# Patient Record
Sex: Female | Born: 1955 | Race: White | Hispanic: No | Marital: Married | Smoking: Never smoker
Health system: Southern US, Community
[De-identification: ages and names within clinical notes are randomized; demographics above are authoritative.]

## PROBLEM LIST (undated history)

## (undated) DIAGNOSIS — M797 Fibromyalgia: Secondary | ICD-10-CM

## (undated) DIAGNOSIS — F419 Anxiety disorder, unspecified: Secondary | ICD-10-CM

## (undated) HISTORY — PX: EYE SURGERY: SHX253

## (undated) HISTORY — PX: TUBAL LIGATION: SHX77

---

## 2017-11-25 ENCOUNTER — Other Ambulatory Visit: Payer: Self-pay | Admitting: Internal Medicine

## 2017-11-25 DIAGNOSIS — Z1231 Encounter for screening mammogram for malignant neoplasm of breast: Secondary | ICD-10-CM

## 2018-01-21 ENCOUNTER — Encounter: Payer: Self-pay | Admitting: Radiology

## 2018-01-21 ENCOUNTER — Ambulatory Visit
Admission: RE | Admit: 2018-01-21 | Discharge: 2018-01-21 | Disposition: A | Discharge status: Home / Self Care | Payer: PRIVATE HEALTH INSURANCE | Source: Ambulatory Visit | Attending: Internal Medicine | Admitting: Internal Medicine

## 2018-01-21 DIAGNOSIS — Z1231 Encounter for screening mammogram for malignant neoplasm of breast: Secondary | ICD-10-CM

## 2018-12-19 ENCOUNTER — Other Ambulatory Visit: Payer: Self-pay | Admitting: Family Medicine

## 2018-12-19 DIAGNOSIS — Z1231 Encounter for screening mammogram for malignant neoplasm of breast: Secondary | ICD-10-CM

## 2019-02-16 ENCOUNTER — Ambulatory Visit: Payer: PRIVATE HEALTH INSURANCE

## 2019-02-25 ENCOUNTER — Other Ambulatory Visit: Payer: Self-pay

## 2019-02-25 ENCOUNTER — Ambulatory Visit
Admission: RE | Admit: 2019-02-25 | Discharge: 2019-02-25 | Disposition: A | Discharge status: Home / Self Care | Payer: PRIVATE HEALTH INSURANCE | Source: Ambulatory Visit | Attending: Family Medicine | Admitting: Family Medicine

## 2019-02-25 DIAGNOSIS — Z1231 Encounter for screening mammogram for malignant neoplasm of breast: Secondary | ICD-10-CM

## 2019-07-10 ENCOUNTER — Other Ambulatory Visit: Payer: Self-pay

## 2019-07-10 DIAGNOSIS — Z20822 Contact with and (suspected) exposure to covid-19: Secondary | ICD-10-CM

## 2019-07-12 LAB — NOVEL CORONAVIRUS, NAA: SARS-CoV-2, NAA: NOT DETECTED

## 2019-10-04 ENCOUNTER — Other Ambulatory Visit: Payer: Self-pay

## 2019-10-04 ENCOUNTER — Encounter (HOSPITAL_BASED_OUTPATIENT_CLINIC_OR_DEPARTMENT_OTHER): Payer: Self-pay | Admitting: Obstetrics & Gynecology

## 2019-10-06 ENCOUNTER — Other Ambulatory Visit: Payer: Self-pay

## 2019-10-06 ENCOUNTER — Encounter (HOSPITAL_BASED_OUTPATIENT_CLINIC_OR_DEPARTMENT_OTHER)
Admission: RE | Admit: 2019-10-06 | Discharge: 2019-10-06 | Disposition: A | Discharge status: Home / Self Care | Payer: PRIVATE HEALTH INSURANCE | Source: Ambulatory Visit | Attending: Obstetrics & Gynecology | Admitting: Obstetrics & Gynecology

## 2019-10-06 DIAGNOSIS — Z01812 Encounter for preprocedural laboratory examination: Secondary | ICD-10-CM | POA: Insufficient documentation

## 2019-10-06 LAB — COMPREHENSIVE METABOLIC PANEL
ALT: 16 U/L (ref 0–44)
AST: 21 U/L (ref 15–41)
Albumin: 3.8 g/dL (ref 3.5–5.0)
Alkaline Phosphatase: 61 U/L (ref 38–126)
Anion gap: 8 (ref 5–15)
BUN: 21 mg/dL (ref 8–23)
CO2: 31 mmol/L (ref 22–32)
Calcium: 9.4 mg/dL (ref 8.9–10.3)
Chloride: 106 mmol/L (ref 98–111)
Creatinine, Ser: 0.75 mg/dL (ref 0.44–1.00)
GFR calc Af Amer: >60 mL/min (ref >60)
GFR calc non Af Amer: >60 mL/min (ref >60)
Glucose, Bld: 86 mg/dL (ref 70–99)
Potassium: 5.3 mmol/L — ABNORMAL HIGH (ref 3.5–5.1)
Sodium: 145 mmol/L (ref 135–145)
Total Bilirubin: 0.3 mg/dL (ref 0.3–1.2)
Total Protein: 6.5 g/dL (ref 6.5–8.1)

## 2019-10-06 LAB — CBC
HCT: 41.9 % (ref 36.0–46.0)
Hemoglobin: 13.8 g/dL (ref 12.0–15.0)
MCH: 31.4 pg (ref 26.0–34.0)
MCHC: 32.9 g/dL (ref 30.0–36.0)
MCV: 95.2 fL (ref 80.0–100.0)
Platelets: 295 10*3/uL (ref 150–400)
RBC: 4.4 MIL/uL (ref 3.87–5.11)
RDW: 11.9 % (ref 11.5–15.5)
WBC: 4.6 10*3/uL (ref 4.0–10.5)
nRBC: 0 % (ref 0.0–0.2)

## 2019-10-06 NOTE — Progress Notes (Signed)

## 2019-10-06 NOTE — Progress Notes (Signed)
Potassium 5.3. Reviewed with Dr. Sampson Goon. Okay to proceed with surgery as scheduled.

## 2019-10-07 ENCOUNTER — Other Ambulatory Visit (HOSPITAL_COMMUNITY)
Admission: RE | Admit: 2019-10-07 | Discharge: 2019-10-07 | Disposition: A | Discharge status: Home / Self Care | Payer: PRIVATE HEALTH INSURANCE | Source: Ambulatory Visit | Attending: Obstetrics & Gynecology | Admitting: Obstetrics & Gynecology

## 2019-10-07 DIAGNOSIS — Z01812 Encounter for preprocedural laboratory examination: Secondary | ICD-10-CM | POA: Insufficient documentation

## 2019-10-07 DIAGNOSIS — Z20822 Contact with and (suspected) exposure to covid-19: Secondary | ICD-10-CM | POA: Diagnosis not present

## 2019-10-07 NOTE — H&P (Signed)
64yo PM female who presents for hysteroscopy, D&C due to postmenopausal bleeding.  Today she notes that initially she didn't have any bleeding, but then out of nowhere starting on 1/6- she noted pink spotting when she wiped. Then the next day in the toilet and on a pad bright red blood that lasted for the day and then maybe a little spotting the next day. Denies bleeding after sex. No further bleeding since that time.  She was last seen for this, 08/09/2018. Previously noted that on Monday 11/16- out of nowhere- she went to the bathroom- and noted bright red blood. Blood was in toilet and on toilet paper. Prior to this episdoe this has never happened. Denies vaginal discharge, itching or irritation. No pelvic or abdominal pain. No other acute complaints   US showed: 7.2cm anteverted uterus with thickened lining with tiny cystic areas within- no blood flow noted. Normal ovaries bilaterally EMB: 11/19- scant fragments of benign endometrium  Last pap/hpv 11/2017 neg Last mam 02/2019 Cat. I    Current Medications  Taking   Acetaminophen-Codeine 300-30 MG Tablet 1 tablet as needed Orally once a day as needed   Calcium 1 capsule Orally Once a day   Effexor XR(Venlafaxine HCl ER) 150 MG Capsule Extended Release 24 Hour take 1 capsule by mouth once every day with food Orally Once a day   FiberCon(Fiber) 625 MG Tablet 2 tablets Orally once a day   Medication List reviewed and reconciled with the patient    Past Medical History  Anxiety.   Endometrial cells on cervical Pap smear inconsistent w/LMP.   Fibromyalgia.   FH: breast cancer paunt and maunt Pt Ashkenazi .   Colon adenoma 2011 repeat hyperplastic cecal polyp 2016.   H/O dysplastic nevus, seen by dermatology Dr. Venancio Poisson.   Last mammogram 12/16/2016 BIRADS 1 ; 02/2019 normal.           Surgical History  Tugal Ligation 1987  Cataracts- bilateral 03/2018  Endometrial biopsy 07/2019   Family History  Father: alive, dementia,  diagnosed with Hypertension  Mother: deceased, high cholesterol, heart failure and kidney failure, Hypertension  Paternal Grand Father: deceased  Paternal Grand Mother: deceased, MI  Maternal Grand Father: deceased, GI cancer  Maternal Grand Mother: deceased  Brother 1: alive  1 brother(s) - healthy.   Breast CA- P aunt and Maunt. No GYN CA.   Social History  General:  Tobacco use  cigarettes: Never smoked Tobacco history last updated 10/02/2019 Additional Findings: Tobacco Non-User Non-smoker for personal reasons Alcohol: yes, beer/wine, 1 drink a day.  no Caffeine.  no Recreational drug use.  Exercise: 5+ times weekly.  Marital Status: married.  Children: 1 son 1 daughter, 3 grandchildren (1 lives in [place]).  OCCUPATION: Charity fundraiser, retired.  Religion: Jewish.  Seat belt use: yes.  Moved to [place] in 2018. Son got a job at [employer]. Husband is [employer]. Has a 38 year old father at [employer]. He has some dementia. Talks to father qid b/c she cannot visit much due to COVID. Takes care of grandchildren, age 86, 4 year and 4 month; daughter lives here with 2 kids; son is in [place], [place].   Gyn History  Sexual activity currently sexually active.  LMP 3 yrs ago.  Birth control tubal ligation .  Last pap smear date 11-2017 neg .  Last mammogram date 02-2019 .  menopause 2 yrs ago.    OB History  Number of pregnancies 2.  Pregnancy # 1 vaginal delivery.  Pregnancy # 2 vaginal delivery.  Full-term delivery 2.    Allergies  N.K.D.A.   Hospitalization/Major Diagnostic Procedure  none in the past year 09-2019   Review of Systems  CONSTITUTIONAL:  no Appetite changes. no Chills. no Fatigue. no Fever.  CARDIOLOGY:  no Chest pain.  RESPIRATORY:  no Shortness of breath. no Cough.  UROLOGY:  no Dysuria. no Urinary frequency. no Urinary incontinence. no Urinary urgency.  GASTROENTEROLOGY:  no Abdominal pain. no Change in bowel habits. no Change in bowel movements. Constipation yes.   FEMALE REPRODUCTIVE:  See HPI for details. no Abnormal vaginal discharge. no Breast lumps or discharge. no Breast pain. no Hot flashes. no Sexual problems. no Vaginal irritation. no Vaginal itching.  NEUROLOGY:  no Dizziness. no Headache.  PSYCHOLOGY:  Anxiety yes. no Depression.  SKIN:  no Rash. no Hives.  HEMATOLOGY/LYMPH:  no Anemia. Using Blood Thinners no.     Vital Signs  Wt 96.0, Wt change -.4 lb, Ht 63.25, BMI 16.87, Temp 96.9, Pulse sitting 76, BP sitting 100/60.   Examination  General Examination: CONSTITUTIONAL: well developed, well nourished.  SKIN: warm and dry, no rashes.  NECK: supple, normal appearance.  LUNGS: clear to auscultation bilaterally, no wheezes, rhonchi, rales.  HEART: no murmurs, regular rate and rhythm.  ABDOMEN: soft and not tender, no masses palpated, no rebound, no rigidity.  FEMALE GENITOURINARY: normal external genitalia, labia - unremarkable, vagina - pink moist mucosa, no lesions or abnormal discharge, cervix - no discharge or lesions or CMT.  EXTREMITIES: no edema present.  PSYCH: appropriate mood and affect   A/P: 64yo PM female who presents for hysteroscopy, D&C due to postmenopausal bleeding -NPO -LR @ 125cc/hr -SCDs to OR -Risk/benefits reviewed with patient including but not limited to risk of bleeding infection and potential for uterine perforation.  Questions and concerns were addressed and she desires to proceed  Kylie Pupa, DO [phone] (cell) [phone] (office)

## 2019-10-08 LAB — NOVEL CORONAVIRUS, NAA (HOSP ORDER, SEND-OUT TO REF LAB; TAT 18-24 HRS): SARS-CoV-2, NAA: NOT DETECTED

## 2019-10-11 ENCOUNTER — Ambulatory Visit (HOSPITAL_BASED_OUTPATIENT_CLINIC_OR_DEPARTMENT_OTHER): Payer: PRIVATE HEALTH INSURANCE | Admitting: Anesthesiology

## 2019-10-11 ENCOUNTER — Encounter (HOSPITAL_BASED_OUTPATIENT_CLINIC_OR_DEPARTMENT_OTHER)
Admission: RE | Disposition: A | Discharge status: Home / Self Care | Payer: Self-pay | Source: Home / Self Care | Attending: Obstetrics & Gynecology

## 2019-10-11 ENCOUNTER — Ambulatory Visit (HOSPITAL_BASED_OUTPATIENT_CLINIC_OR_DEPARTMENT_OTHER)
Admission: RE | Admit: 2019-10-11 | Discharge: 2019-10-11 | Disposition: A | Discharge status: Home / Self Care | Payer: PRIVATE HEALTH INSURANCE | Attending: Obstetrics & Gynecology | Admitting: Obstetrics & Gynecology

## 2019-10-11 ENCOUNTER — Encounter (HOSPITAL_BASED_OUTPATIENT_CLINIC_OR_DEPARTMENT_OTHER): Payer: Self-pay | Admitting: Obstetrics & Gynecology

## 2019-10-11 ENCOUNTER — Other Ambulatory Visit: Payer: Self-pay

## 2019-10-11 DIAGNOSIS — N950 Postmenopausal bleeding: Secondary | ICD-10-CM

## 2019-10-11 DIAGNOSIS — M797 Fibromyalgia: Secondary | ICD-10-CM | POA: Insufficient documentation

## 2019-10-11 DIAGNOSIS — N840 Polyp of corpus uteri: Secondary | ICD-10-CM | POA: Insufficient documentation

## 2019-10-11 DIAGNOSIS — F419 Anxiety disorder, unspecified: Secondary | ICD-10-CM | POA: Insufficient documentation

## 2019-10-11 HISTORY — DX: Fibromyalgia: M79.7

## 2019-10-11 HISTORY — PX: DILATATION & CURETTAGE/HYSTEROSCOPY WITH MYOSURE: SHX6511

## 2019-10-11 HISTORY — DX: Anxiety disorder, unspecified: F41.9

## 2019-10-11 SURGERY — DILATATION & CURETTAGE/HYSTEROSCOPY WITH MYOSURE
Anesthesia: General | Site: Vagina

## 2019-10-11 MED ORDER — OXYCODONE HCL 5 MG/5ML PO SOLN: 5.0000 mg | Freq: Once | ORAL | Status: DC | PRN

## 2019-10-11 MED ORDER — LIDOCAINE 2% (20 MG/ML) 5 ML SYRINGE
INTRAMUSCULAR | Status: AC
  Filled 2019-10-11: qty 5

## 2019-10-11 MED ORDER — LIDOCAINE-EPINEPHRINE 1 %-1:100000 IJ SOLN
INTRAMUSCULAR | Status: AC
  Filled 2019-10-11: qty 1

## 2019-10-11 MED ORDER — MIDAZOLAM HCL 2 MG/2ML IJ SOLN: 1.0000 mg | INTRAMUSCULAR | Status: DC | PRN

## 2019-10-11 MED ORDER — LACTATED RINGERS IV SOLN: INTRAVENOUS | Status: DC

## 2019-10-11 MED ORDER — PROPOFOL 10 MG/ML IV BOLUS
INTRAVENOUS | Status: DC | PRN
  Administered 2019-10-11: 120 mg via INTRAVENOUS

## 2019-10-11 MED ORDER — FENTANYL CITRATE (PF) 100 MCG/2ML IJ SOLN
INTRAMUSCULAR | Status: AC
  Filled 2019-10-11: qty 2

## 2019-10-11 MED ORDER — MIDAZOLAM HCL 2 MG/2ML IJ SOLN
INTRAMUSCULAR | Status: AC
  Filled 2019-10-11: qty 2

## 2019-10-11 MED ORDER — MIDAZOLAM HCL 5 MG/5ML IJ SOLN
INTRAMUSCULAR | Status: DC | PRN
  Administered 2019-10-11: 2 mg via INTRAVENOUS

## 2019-10-11 MED ORDER — SOD CITRATE-CITRIC ACID 500-334 MG/5ML PO SOLN
30.0000 mL | ORAL | Status: AC
  Administered 2019-10-11: 12:00:00 30 mL via ORAL

## 2019-10-11 MED ORDER — PROMETHAZINE HCL 25 MG/ML IJ SOLN: 6.2500 mg | INTRAMUSCULAR | Status: DC | PRN

## 2019-10-11 MED ORDER — DEXAMETHASONE SODIUM PHOSPHATE 10 MG/ML IJ SOLN
INTRAMUSCULAR | Status: AC
  Filled 2019-10-11: qty 1

## 2019-10-11 MED ORDER — ACETAMINOPHEN 500 MG PO TABS
ORAL_TABLET | ORAL | Status: AC
  Filled 2019-10-11: qty 2

## 2019-10-11 MED ORDER — SILVER NITRATE-POT NITRATE 75-25 % EX MISC
CUTANEOUS | Status: AC
  Filled 2019-10-11: qty 10

## 2019-10-11 MED ORDER — FENTANYL CITRATE (PF) 100 MCG/2ML IJ SOLN: 25.0000 ug | INTRAMUSCULAR | Status: DC | PRN

## 2019-10-11 MED ORDER — SODIUM CHLORIDE 0.9 % IR SOLN
Status: DC | PRN
  Administered 2019-10-11: 3000 mL

## 2019-10-11 MED ORDER — FENTANYL CITRATE (PF) 100 MCG/2ML IJ SOLN: 50.0000 ug | INTRAMUSCULAR | Status: DC | PRN

## 2019-10-11 MED ORDER — ACETAMINOPHEN 500 MG PO TABS
1000.0000 mg | ORAL_TABLET | Freq: Once | ORAL | Status: AC
  Administered 2019-10-11: 1000 mg via ORAL

## 2019-10-11 MED ORDER — ONDANSETRON HCL 4 MG/2ML IJ SOLN
INTRAMUSCULAR | Status: DC | PRN
  Administered 2019-10-11: 4 mg via INTRAVENOUS

## 2019-10-11 MED ORDER — LIDOCAINE 2% (20 MG/ML) 5 ML SYRINGE
INTRAMUSCULAR | Status: DC | PRN
  Administered 2019-10-11: 60 mg via INTRAVENOUS

## 2019-10-11 MED ORDER — ONDANSETRON HCL 4 MG/2ML IJ SOLN
INTRAMUSCULAR | Status: AC
  Filled 2019-10-11: qty 2

## 2019-10-11 MED ORDER — LIDOCAINE-EPINEPHRINE 1 %-1:100000 IJ SOLN
INTRAMUSCULAR | Status: DC | PRN
  Administered 2019-10-11: 20 mL

## 2019-10-11 MED ORDER — OXYCODONE HCL 5 MG PO TABS: 5.0000 mg | ORAL_TABLET | Freq: Once | ORAL | Status: DC | PRN

## 2019-10-11 MED ORDER — DEXAMETHASONE SODIUM PHOSPHATE 10 MG/ML IJ SOLN
INTRAMUSCULAR | Status: DC | PRN
  Administered 2019-10-11: 4 mg via INTRAVENOUS

## 2019-10-11 MED ORDER — FENTANYL CITRATE (PF) 100 MCG/2ML IJ SOLN
INTRAMUSCULAR | Status: DC | PRN
  Administered 2019-10-11 (×2): 50 ug via INTRAVENOUS

## 2019-10-11 MED ORDER — SOD CITRATE-CITRIC ACID 500-334 MG/5ML PO SOLN
ORAL | Status: AC
  Filled 2019-10-11: qty 30

## 2019-10-11 SURGICAL SUPPLY — 18 items
CATH ROBINSON RED A/P 16FR (CATHETERS) ×2 IMPLANT
DEVICE MYOSURE LITE (MISCELLANEOUS) IMPLANT
DEVICE MYOSURE REACH (MISCELLANEOUS) ×2 IMPLANT
DILATOR CANAL MILEX (MISCELLANEOUS) IMPLANT
GAUZE 4X4 16PLY RFD (DISPOSABLE) ×3 IMPLANT
GLOVE BIOGEL PI IND STRL 6.5 (GLOVE) ×1 IMPLANT
GLOVE BIOGEL PI IND STRL 7.0 (GLOVE) ×1 IMPLANT
GLOVE BIOGEL PI INDICATOR 6.5 (GLOVE) ×2
GLOVE BIOGEL PI INDICATOR 7.0 (GLOVE) ×2
GLOVE ECLIPSE 6.5 STRL STRAW (GLOVE) ×3 IMPLANT
GOWN STRL REUS W/TWL LRG LVL3 (GOWN DISPOSABLE) ×6 IMPLANT
KIT PROCEDURE FLUENT (KITS) ×3 IMPLANT
PACK VAGINAL MINOR WOMEN LF (CUSTOM PROCEDURE TRAY) ×3 IMPLANT
PAD OB MATERNITY 4.3X12.25 (PERSONAL CARE ITEMS) ×3 IMPLANT
PAD PREP 24X48 CUFFED NSTRL (MISCELLANEOUS) ×3 IMPLANT
SEAL ROD LENS SCOPE MYOSURE (ABLATOR) ×3 IMPLANT
SLEEVE SCD COMPRESS KNEE MED (MISCELLANEOUS) ×3 IMPLANT
TOWEL GREEN STERILE FF (TOWEL DISPOSABLE) ×6 IMPLANT

## 2019-10-11 NOTE — Anesthesia Postprocedure Evaluation (Signed)
Anesthesia Post Note  Patient: Kylie Lopez  Procedure(s) Performed: DILATATION & CURETTAGE/HYSTEROSCOPY WITH MYOSURE (N/A Vagina )     Patient location during evaluation: Phase II Anesthesia Type: General Level of consciousness: awake Pain management: pain level controlled Vital Signs Assessment: post-procedure vital signs reviewed and stable Respiratory status: spontaneous breathing Cardiovascular status: stable Postop Assessment: no apparent nausea or vomiting Anesthetic complications: no    Last Vitals:  Vitals:   10/11/19 1315 10/11/19 1330  BP: (!) 121/53 (!) 112/58  Pulse: 92 89  Resp: 14 12  Temp:    SpO2: 97% 97%    Last Pain:  Vitals:   10/11/19 1315  TempSrc:   PainSc: 0-No pain   Pain Goal:                   Caren Macadam

## 2019-10-11 NOTE — Op Note (Signed)
Operative Report  PreOp: postmenopausal bleeding PostOp: same and uterine polyp Procedure:  Hysteroscopy, Dilation and Curettage, myosure polypectomy Surgeon: Dr. Myna Hidalgo Anesthesia: General, cervical block Complications:none EBL: 10cc UOP: 50cc IVF:800cc Discrepancy: 100cc  Findings:7cm anteverted uterus with ~ 1.5cm polyp anterior wall and small 0.5cm posterior polyp, both ostia seen Specimens: 1) endometrial polyp and curettings  Procedure: The patient was taken to the operating room where she underwent general anesthesia without difficulty. The patient was placed in a low lithotomy position using Allen stirrups. The patient was examined with the findings as noted above.  She was then prepped and draped in the normal sterile fashion. The bladder was drained using a red rubber urethral catheter. A sterile speculum was inserted into the vagina. 20cc of 1% lidocaine with epinephrine was used for a cervical block.  A single tooth tenaculum was placed on the anterior lip of the cervix. The uterus was then sounded to 7cm. The endocervical canal was then serially dilated to 14French using Hank dilators.  The diagnostic hysteroscope was then inserted without difficulty and noted to have the findings as listed above. The myosure was inserted and resection of the uterine polyps was completed.  The hysteroscope was removed and sharp curettage was performed.  The tissue was sent to pathology. The hysteroscope was reinserted, no uterine perforation was seen. All instrument were then removed. Hemostasis was observed at the cervical site. The patient was repositioned to the supine position. The patient tolerated the procedure without any complications and taken to recovery in stable condition.   Myna Hidalgo, DO [phone] (pager) [phone] (office)

## 2019-10-11 NOTE — Progress Notes (Signed)
Notified Dr. Arby Barrette, anesthesiologist, of K+ result of 5.3 on 10/06/19. No new orders at this time. Okay to proceed as scheduled.

## 2019-10-11 NOTE — Discharge Instructions (Addendum)
No Tylenol before 4:45 PM  HOME INSTRUCTIONS  Please note any unusual or excessive bleeding, pain, swelling. Mild dizziness or drowsiness are normal for about 24 hours after surgery.   Shower when comfortable  Restrictions: No driving for 24 hours or while taking pain medications.  Activity:  No heavy lifting (> 10 lbs), nothing in vagina (no tampons, douching, or intercourse) x 2 weeks; no tub baths for 2 weeks Vaginal spotting is expected but if your bleeding is heavy, period like,  please call the office   Diet:  You may return to your regular diet.  Do not eat large meals.  Eat small frequent meals throughout the day.  Continue to drink a good amount of water at least 6-8 glasses of water per day, hydration is very important for the healing process.  Pain Management: Take Motrin and/or tylenol as needed for pain.  Always take prescription pain medication with food, it may cause constipation, increase fluids and fiber and you may want to take an over-the-counter stool softener like Colace as needed up to 2x a day.    Alcohol -- Avoid for 24 hours and while taking pain medications.  Nausea: Take sips of ginger ale or soda  Fever -- Call physician if temperature over 101 degrees  Follow up:  If you do not already have a follow up appointment scheduled, please call the office at [phone].  If you experience fever (a temperature greater than 100.4), pain unrelieved by pain medication, shortness of breath, swelling of a single leg, or any other symptoms which are concerning to you please the office immediately.     Post Anesthesia Home Care Instructions  Activity: Get plenty of rest for the remainder of the day. A responsible individual must stay with you for 24 hours following the procedure.  For the next 24 hours, DO NOT: -Drive a car -Advertising copywriter -Drink alcoholic beverages -Take any medication unless instructed by your physician -Make any legal decisions or sign  important papers.  Meals: Start with liquid foods such as gelatin or soup. Progress to regular foods as tolerated. Avoid greasy, spicy, heavy foods. If nausea and/or vomiting occur, drink only clear liquids until the nausea and/or vomiting subsides. Call your physician if vomiting continues.  Special Instructions/Symptoms: Your throat may feel dry or sore from the anesthesia or the breathing tube placed in your throat during surgery. If this causes discomfort, gargle with warm salt water. The discomfort should disappear within 24 hours.  If you had a scopolamine patch placed behind your ear for the management of post- operative nausea and/or vomiting:  1. The medication in the patch is effective for 72 hours, after which it should be removed.  Wrap patch in a tissue and discard in the trash. Wash hands thoroughly with soap and water. 2. You may remove the patch earlier than 72 hours if you experience unpleasant side effects which may include dry mouth, dizziness or visual disturbances. 3. Avoid touching the patch. Wash your hands with soap and water after contact with the patch.

## 2019-10-11 NOTE — Anesthesia Procedure Notes (Signed)
Procedure Name: LMA Insertion Date/Time: 10/11/2019 12:23 PM Performed by: Pearson Grippe, CRNA Pre-anesthesia Checklist: Patient identified, Emergency Drugs available, Suction available and Patient being monitored Patient Re-evaluated:Patient Re-evaluated prior to induction Oxygen Delivery Method: Circle system utilized Preoxygenation: Pre-oxygenation with 100% oxygen Induction Type: IV induction Ventilation: Mask ventilation without difficulty LMA: LMA inserted LMA Size: 3.0 Number of attempts: 1 Airway Equipment and Method: Bite block Placement Confirmation: positive ETCO2 Tube secured with: Tape Dental Injury: Teeth and Oropharynx as per pre-operative assessment

## 2019-10-11 NOTE — Transfer of Care (Signed)
Immediate Anesthesia Transfer of Care Note  Patient: Kylie Lopez  Procedure(s) Performed: DILATATION & CURETTAGE/HYSTEROSCOPY WITH MYOSURE (N/A Vagina )  Patient Location: PACU  Anesthesia Type:General  Level of Consciousness: awake, alert  and oriented  Airway & Oxygen Therapy: Patient Spontanous Breathing and Patient connected to face mask oxygen  Post-op Assessment: Report given to RN and Post -op Vital signs reviewed and stable  Post vital signs: Reviewed and stable  Last Vitals:  Vitals Value Taken Time  BP 131/58 10/11/19 1255  Temp 36.9 C 10/11/19 1255  Pulse 97 10/11/19 1255  Resp 15 10/11/19 1255  SpO2 99%     Last Pain:  Vitals:   10/11/19 1136  TempSrc: Temporal  PainSc: 0-No pain         Complications: No apparent anesthesia complications

## 2019-10-11 NOTE — Anesthesia Preprocedure Evaluation (Addendum)
Anesthesia Evaluation  Patient identified by MRN, date of birth, ID band Patient awake    Reviewed: Allergy & Precautions, NPO status , Patient's Chart, lab work & pertinent test results  History of Anesthesia Complications Negative for: history of anesthetic complications  Airway Mallampati: II  TM Distance: >3 FB Neck ROM: Full    Dental no notable dental hx.    Pulmonary neg pulmonary ROS,    Pulmonary exam normal        Cardiovascular negative cardio ROS Normal cardiovascular exam     Neuro/Psych Anxiety negative neurological ROS     GI/Hepatic negative GI ROS, Neg liver ROS,   Endo/Other  negative endocrine ROS  Renal/GU negative Renal ROS  negative genitourinary   Musculoskeletal  (+) Fibromyalgia -  Abdominal   Peds  Hematology negative hematology ROS (+)   Anesthesia Other Findings Day of surgery medications reviewed with patient.  Reproductive/Obstetrics Postmenopausal bleeding                            Anesthesia Physical Anesthesia Plan  ASA: II  Anesthesia Plan: General   Post-op Pain Management:    Induction: Intravenous  PONV Risk Score and Plan: 3 and Treatment may vary due to age or medical condition, Ondansetron, Dexamethasone and Midazolam  Airway Management Planned: LMA  Additional Equipment: None  Intra-op Plan:   Post-operative Plan: Extubation in OR  Informed Consent: I have reviewed the patients History and Physical, chart, labs and discussed the procedure including the risks, benefits and alternatives for the proposed anesthesia with the patient or authorized representative who has indicated his/her understanding and acceptance.     Dental advisory given  Plan Discussed with: CRNA  Anesthesia Plan Comments:        Anesthesia Quick Evaluation

## 2019-10-11 NOTE — Interval H&P Note (Signed)
History and Physical Interval Note:  10/11/2019 12:06 PM  Kylie Lopez  has presented today for surgery, with the diagnosis of N95.0 Postmenopausal bleeding.  The various methods of treatment have been discussed with the patient and family. After consideration of risks, benefits and other options for treatment, the patient has consented to  Procedure(s): DILATATION & CURETTAGE/HYSTEROSCOPY WITH MYOSURE (N/A) as a surgical intervention.  The patient's history has been reviewed, patient examined, no change in status, stable for surgery.  I have reviewed the patient's chart and labs.  Questions were answered to the patient's satisfaction.     Sharon Seller

## 2019-10-12 ENCOUNTER — Encounter: Payer: Self-pay | Admitting: *Deleted

## 2019-10-12 LAB — SURGICAL PATHOLOGY

## 2020-01-25 ENCOUNTER — Other Ambulatory Visit: Payer: Self-pay | Admitting: Family Medicine

## 2020-01-25 DIAGNOSIS — Z1231 Encounter for screening mammogram for malignant neoplasm of breast: Secondary | ICD-10-CM

## 2020-02-01 ENCOUNTER — Encounter (HOSPITAL_COMMUNITY): Payer: Self-pay | Admitting: Emergency Medicine

## 2020-02-01 ENCOUNTER — Other Ambulatory Visit: Payer: Self-pay

## 2020-02-01 ENCOUNTER — Emergency Department (HOSPITAL_COMMUNITY)
Admission: EM | Admit: 2020-02-01 | Discharge: 2020-02-01 | Disposition: A | Discharge status: Home / Self Care | Payer: PRIVATE HEALTH INSURANCE | Attending: Emergency Medicine | Admitting: Emergency Medicine

## 2020-02-01 DIAGNOSIS — L309 Dermatitis, unspecified: Secondary | ICD-10-CM

## 2020-02-01 DIAGNOSIS — Z79899 Other long term (current) drug therapy: Secondary | ICD-10-CM | POA: Insufficient documentation

## 2020-02-01 DIAGNOSIS — L259 Unspecified contact dermatitis, unspecified cause: Secondary | ICD-10-CM | POA: Diagnosis not present

## 2020-02-01 DIAGNOSIS — R21 Rash and other nonspecific skin eruption: Secondary | ICD-10-CM | POA: Diagnosis present

## 2020-02-01 MED ORDER — PREDNISONE 20 MG PO TABS: ORAL_TABLET | ORAL | 0 refills | Status: DC

## 2020-02-01 NOTE — ED Provider Notes (Signed)
[facility] DEPT Provider Note   CSN: [id number] Arrival date & time: 02/01/20  1245     History Chief Complaint  Patient presents with  . Rash    Kylie Lopez is a 64 y.o. female.  HPI 64 year old female presents with rash.  Started 2 or 3 days ago.  She was out gardening and later noticed a rash.  Does not remember specifically coming in contact with anything.  It is itchy.  The worst of the rash seems to be the left forearm where there is a larger cluster.  However it is also on her chest and thighs.  A little bit on her chin.  No fever, lip or tongue swelling, or trouble breathing.  No new medicines, detergents, allergens, etc.  Has tried Benadryl and hydrocortisone cream.  No relief.   Past Medical History:  Diagnosis Date  . Anxiety   . Fibromyalgia     There are no problems to display for this patient.   Past Surgical History:  Procedure Laterality Date  . DILATATION & CURETTAGE/HYSTEROSCOPY WITH MYOSURE N/A 10/11/2019   Procedure: DILATATION & CURETTAGE/HYSTEROSCOPY WITH MYOSURE;  Surgeon: Janyth Pupa, DO;  Location: [facility];  Service: Gynecology;  Laterality: N/A;  . EYE SURGERY     Cataract surgery  . TUBAL LIGATION       OB History   No obstetric history on file.     History reviewed. No pertinent family history.  Social History   Tobacco Use  . Smoking status: Never Smoker  . Smokeless tobacco: Never Used  Substance Use Topics  . Alcohol use: Yes    Comment: 1.0/day  . Drug use: Yes    Types: Marijuana    Comment: occationaly takes a gummy    Home Medications Prior to Admission medications   Medication Sig Start Date End Date Taking? Authorizing Provider  calcium-vitamin D (OSCAL WITH D) 500-200 MG-UNIT tablet Take 1 tablet by mouth.    [provider]  polycarbophil (FIBERCON) 625 MG tablet Take 625 mg by mouth daily.    [provider]  predniSONE (DELTASONE) 20 MG tablet 3  tabs po daily x 3 days, then 2 tabs x 3 days, then 1.5 tabs x 3 days, then 1 tab x 3 days, then 0.5 tabs x 3 days 02/01/20   Sherwood Gambler, MD  venlafaxine [facility]) 100 MG tablet Take 150 mg by mouth 2 (two) times daily.    [provider]    Allergies    Patient has no known allergies.  Review of Systems   Review of Systems  Constitutional: Negative for fever.  HENT: Negative for facial swelling.   Respiratory: Negative for shortness of breath.   Gastrointestinal: Negative for vomiting.  Skin: Positive for rash.    Physical Exam Updated Vital Signs BP 130/75 (BP Location: Left Arm)   Pulse 77   Temp 98.1 F (36.7 C) (Oral)   Resp 18   Physical Exam Vitals and nursing note reviewed.  Constitutional:      General: She is not in acute distress.    Appearance: She is well-developed. She is not ill-appearing or diaphoretic.  HENT:     Head: Normocephalic and atraumatic.     Right Ear: External ear normal.     Left Ear: External ear normal.     Nose: Nose normal.  Eyes:     General:        Right eye: No discharge.  Left eye: No discharge.  Cardiovascular:     Rate and Rhythm: Normal rate and regular rhythm.     Heart sounds: Normal heart sounds.  Pulmonary:     Effort: Pulmonary effort is normal.     Breath sounds: Normal breath sounds.  Abdominal:     General: There is no distension.     Palpations: Abdomen is soft.     Tenderness: There is no abdominal tenderness.  Skin:    General: Skin is warm and dry.     Findings: Rash present.     Comments: Scattered red and mildly raised rash.  Noticeable on her left forearm as well as in her left antecubital fossa, bilateral thighs, and left chest/upper abdomen.  No obvious fluctuance or abscess.  Does not follow dermatomal distribution.  Neurological:     Mental Status: She is alert.  Psychiatric:        Mood and Affect: Mood is not anxious.     ED Results / Procedures / Treatments   Labs (all labs  ordered are listed, but only abnormal results are displayed) Labs Reviewed - No data to display  EKG None  Radiology No results found.  Procedures Procedures (including critical care time)  Medications Ordered in ED Medications - No data to display  ED Course  I have reviewed the triage vital signs and the nursing notes.  Pertinent labs & imaging results that were available during my care of the patient were reviewed by me and considered in my medical decision making (see chart for details).    MDM Rules/Calculators/A&P                      Given the appearance, this appears like a contact dermatitis though it is in some areas that would not typically be contacted like her proximal thighs and chest.  We will add prednisone and have her follow-up with dermatology if not improving.  She has no signs or symptoms of anaphylaxis.  She is well-appearing otherwise.  No fevers or concern for emergent cause of rash.  Of note, pulse ox checked by myself and is 98% Final Clinical Impression(s) / ED Diagnoses Final diagnoses:  Acute dermatitis    Rx / DC Orders ED Discharge Orders         Ordered    predniSONE (DELTASONE) 20 MG tablet     02/01/20 0734           Pricilla Loveless, MD 02/01/20 [id number]

## 2020-02-01 NOTE — ED Notes (Signed)
ED Provider at bedside. 

## 2020-02-01 NOTE — Discharge Instructions (Signed)
If you develop fever, vomiting, trouble breathing, lip or tongue swelling, or any other new/concerning symptoms then return to the ER for evaluation.

## 2020-02-01 NOTE — ED Triage Notes (Signed)
Pt reports rash on abd legs arm and neck onset mONDAY PAST . Aveeno , benadryl and other otc meds attempted w/o success

## 2020-02-26 ENCOUNTER — Other Ambulatory Visit: Payer: Self-pay

## 2020-02-26 ENCOUNTER — Ambulatory Visit
Admission: RE | Admit: 2020-02-26 | Discharge: 2020-02-26 | Disposition: A | Discharge status: Home / Self Care | Payer: PRIVATE HEALTH INSURANCE | Source: Ambulatory Visit | Attending: Family Medicine | Admitting: Family Medicine

## 2020-02-26 DIAGNOSIS — Z1231 Encounter for screening mammogram for malignant neoplasm of breast: Secondary | ICD-10-CM

## 2020-05-22 ENCOUNTER — Other Ambulatory Visit: Payer: Self-pay | Admitting: Family Medicine

## 2020-05-22 DIAGNOSIS — R1012 Left upper quadrant pain: Secondary | ICD-10-CM

## 2020-05-31 ENCOUNTER — Other Ambulatory Visit: Payer: PRIVATE HEALTH INSURANCE

## 2021-01-15 ENCOUNTER — Other Ambulatory Visit: Payer: Self-pay | Admitting: Family Medicine

## 2021-01-15 DIAGNOSIS — Z1231 Encounter for screening mammogram for malignant neoplasm of breast: Secondary | ICD-10-CM

## 2021-03-19 ENCOUNTER — Other Ambulatory Visit: Payer: Self-pay

## 2021-03-19 ENCOUNTER — Ambulatory Visit
Admission: RE | Admit: 2021-03-19 | Discharge: 2021-03-19 | Disposition: A | Discharge status: Home / Self Care | Payer: 59 | Source: Ambulatory Visit | Attending: Family Medicine | Admitting: Family Medicine

## 2021-03-19 DIAGNOSIS — Z1231 Encounter for screening mammogram for malignant neoplasm of breast: Secondary | ICD-10-CM

## 2021-10-06 IMAGING — MG DIGITAL SCREENING BILAT W/ TOMO W/ CAD
8 series · 9 of 24 positions shown · non-contrast
Comparison: Previous exam(s).

CLINICAL DATA: Screening.

EXAM:
DIGITAL SCREENING BILATERAL MAMMOGRAM WITH TOMO AND CAD

[R MLO synth-2D]
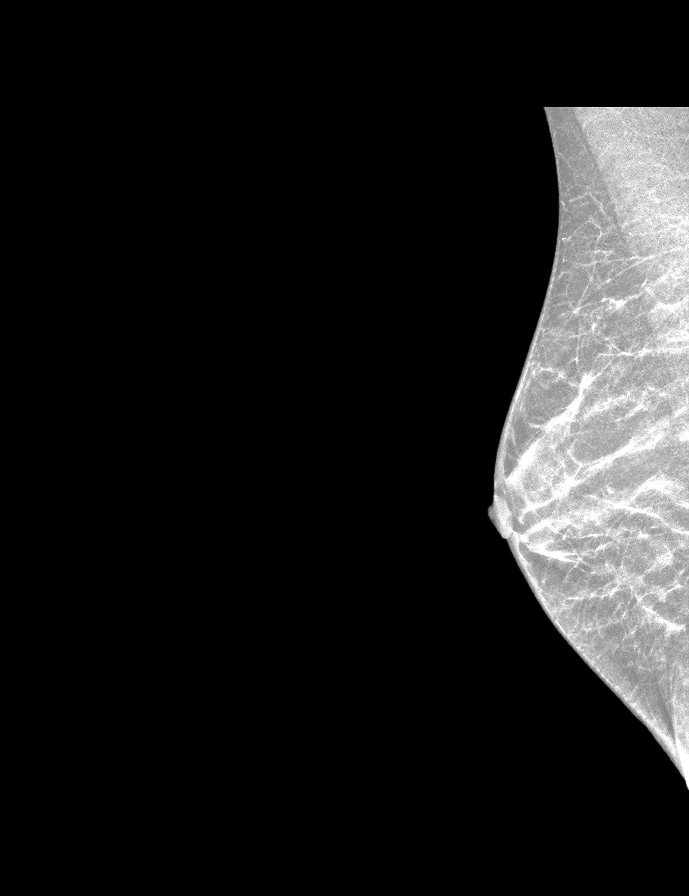

[R CC synth-2D]
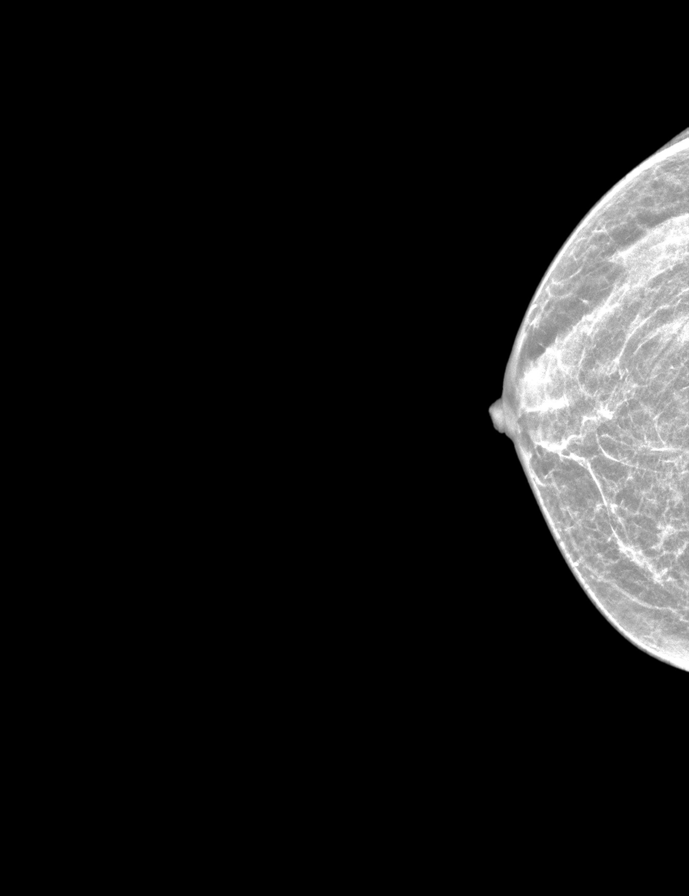

[L CC synth-2D]
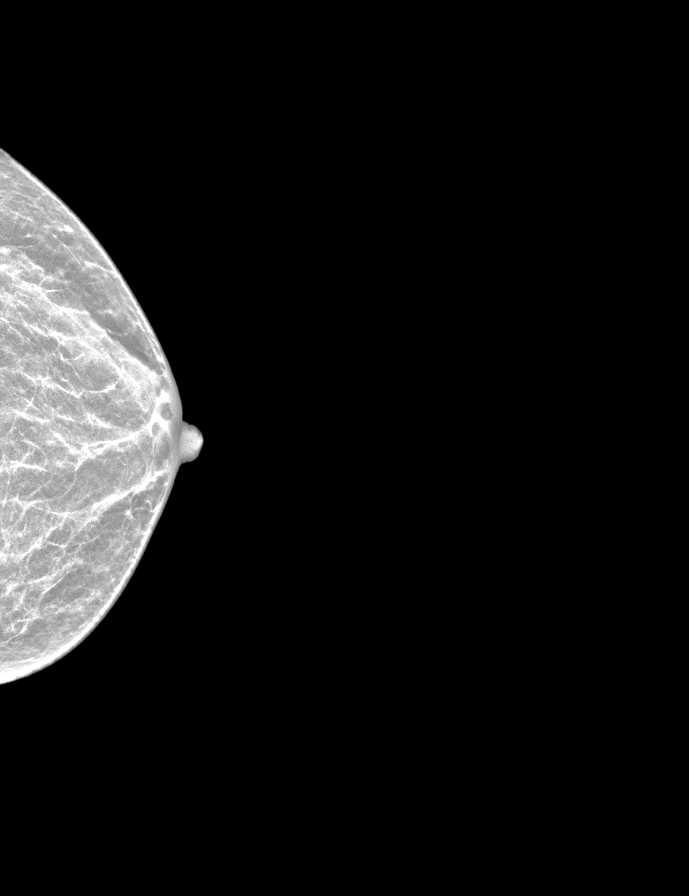

[L MLO synth-2D]
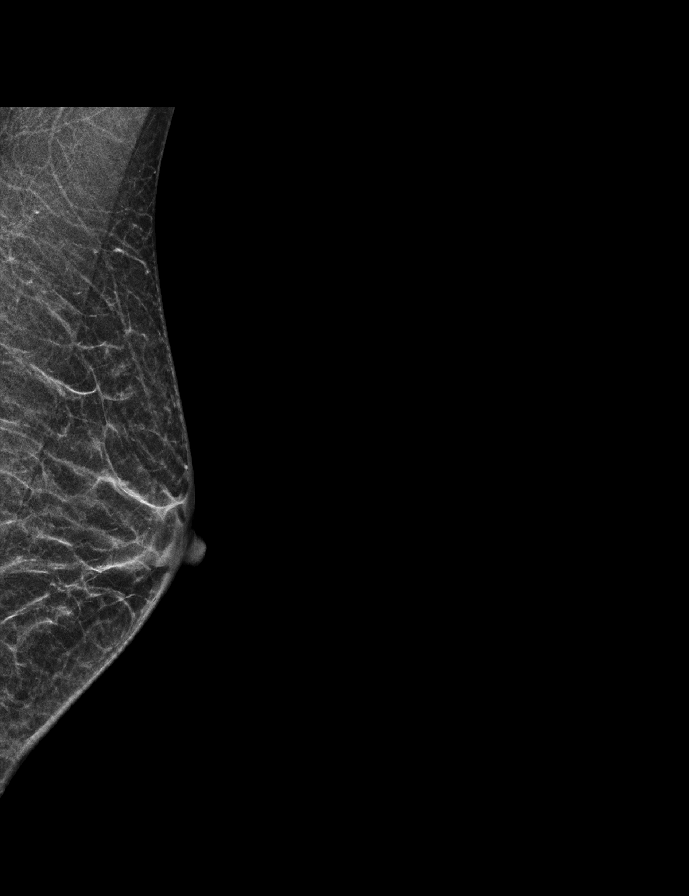

[L CC tomo · 2 of 30 frames shown]
[frame 10/30]
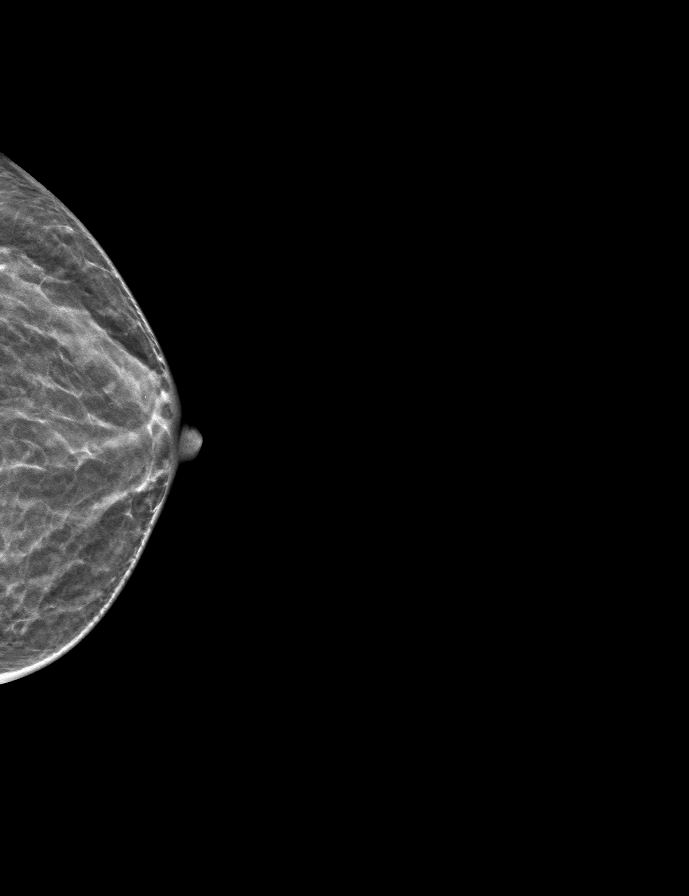
[frame 15/30]
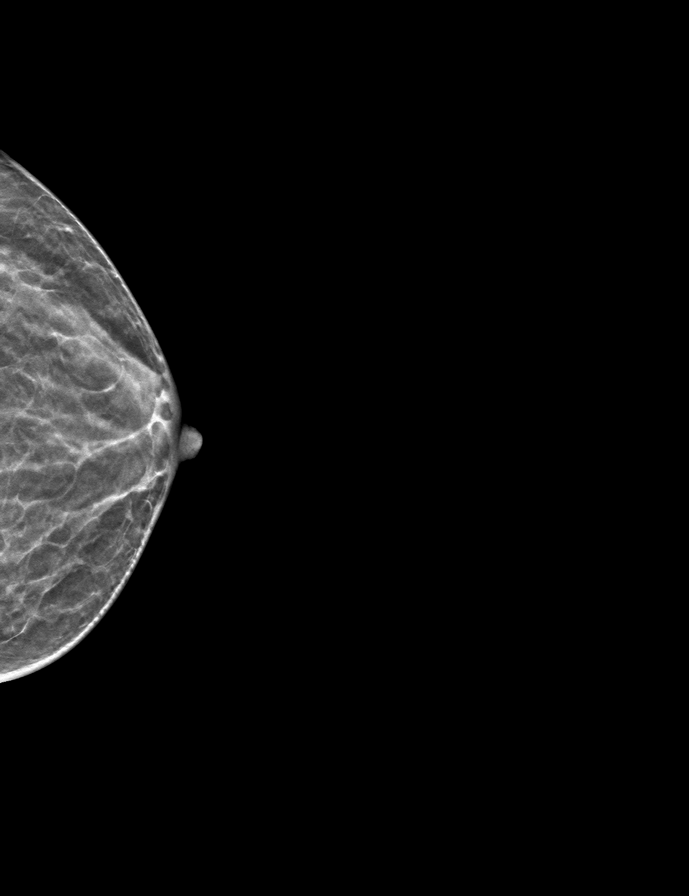

[R MLO tomo · tomo slice 19/36.0]
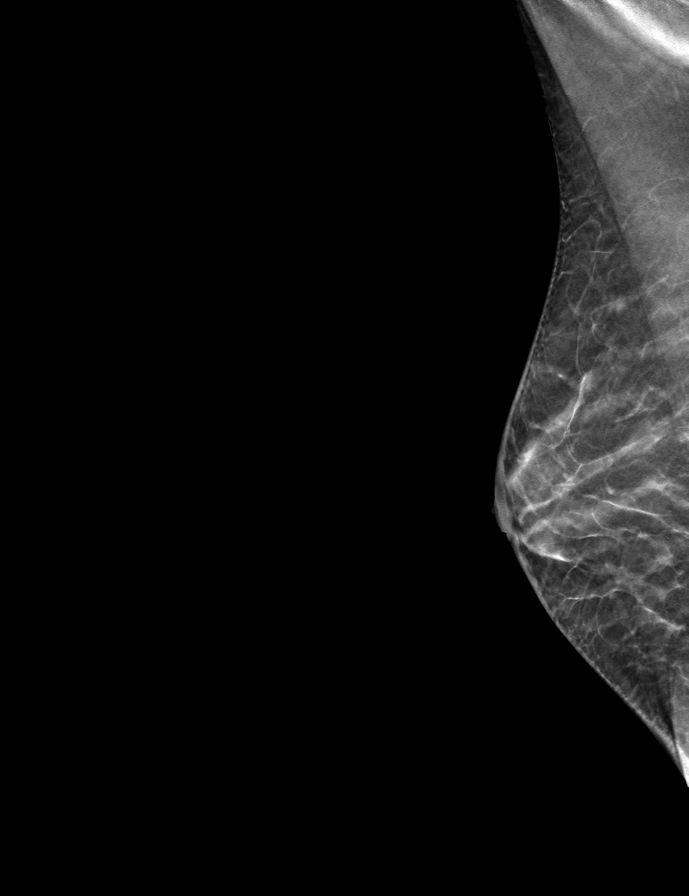

[R CC tomo · tomo slice 17/33.0]
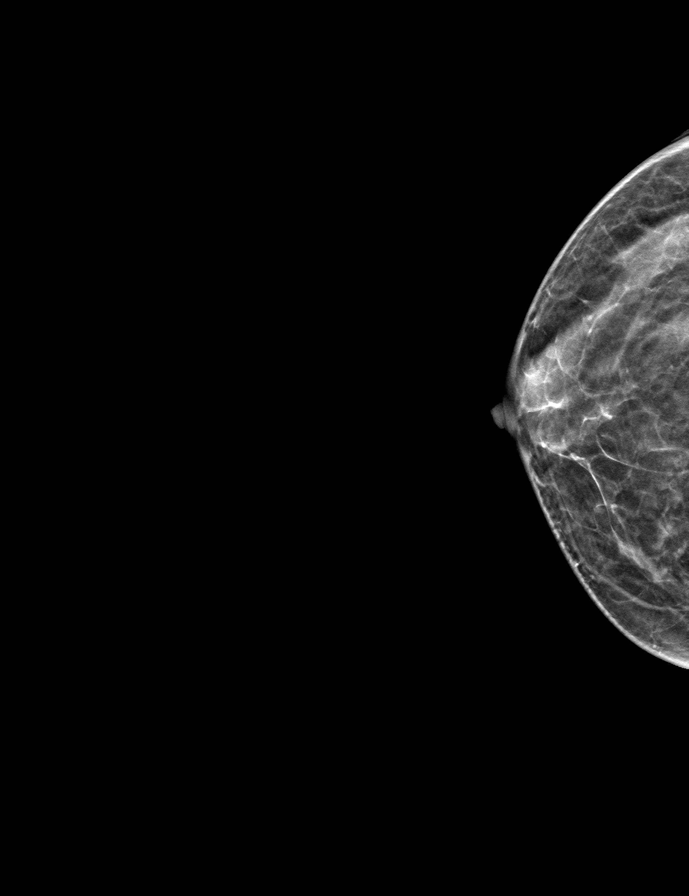

[L MLO tomo · tomo slice 17/34.0]
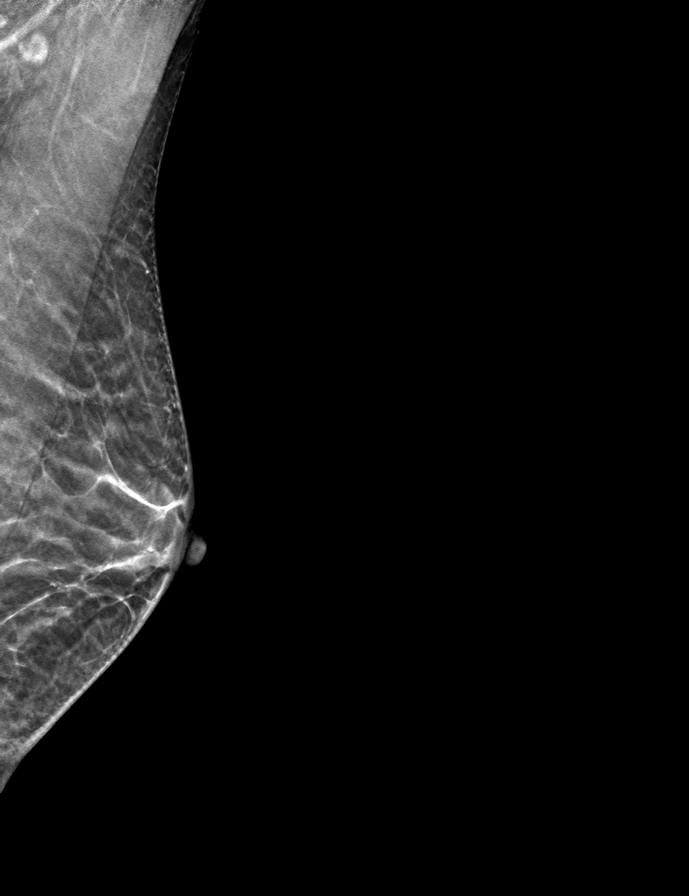

[9 of 24 positions shown; findings below may reference images not displayed]

ACR Breast Density Category b: There are scattered areas of
fibroglandular density.
FINDINGS: There are no findings suspicious for malignancy. Images were
processed with CAD.
IMPRESSION: No mammographic evidence of malignancy. A result letter of this
screening mammogram will be mailed directly to the patient.

RECOMMENDATION:
Screening mammogram in one year. (Code:[id number])

BI-RADS CATEGORY  1: Negative.

## 2022-02-05 ENCOUNTER — Other Ambulatory Visit: Payer: Self-pay | Admitting: Family Medicine

## 2022-03-16 ENCOUNTER — Ambulatory Visit (INDEPENDENT_AMBULATORY_CARE_PROVIDER_SITE_OTHER): Payer: 59 | Admitting: Urology

## 2022-03-16 ENCOUNTER — Encounter: Payer: Self-pay | Admitting: Urology

## 2022-03-16 VITALS — BP 128/75 | HR 73 | Ht 63.0 in | Wt 105.0 lb

## 2022-03-16 DIAGNOSIS — R319 Hematuria, unspecified: Secondary | ICD-10-CM

## 2022-03-16 DIAGNOSIS — N939 Abnormal uterine and vaginal bleeding, unspecified: Secondary | ICD-10-CM | POA: Diagnosis not present

## 2022-03-16 LAB — URINALYSIS, COMPLETE
Bilirubin, UA: NEGATIVE
Glucose, UA: NEGATIVE
Ketones, UA: NEGATIVE
Nitrite, UA: NEGATIVE
RBC, UA: NEGATIVE
Specific Gravity, UA: 1.02 (ref 1.005–1.030)
Urobilinogen, Ur: 0.2 mg/dL (ref 0.2–1.0)
pH, UA: 7 (ref 5.0–7.5)

## 2022-03-16 LAB — MICROSCOPIC EXAMINATION: RBC, Urine: NONE SEEN /hpf (ref 0–2)

## 2022-03-19 ENCOUNTER — Telehealth: Payer: Self-pay

## 2022-03-19 LAB — CULTURE, URINE COMPREHENSIVE

## 2022-03-19 MED ORDER — NITROFURANTOIN MONOHYD MACRO 100 MG PO CAPS: 100.0000 mg | ORAL_CAPSULE | Freq: Two times a day (BID) | ORAL | 0 refills | Status: AC

## 2022-03-19 NOTE — Telephone Encounter (Signed)
-----   Message from Alfredo Martinez, MD sent at 03/19/2022  1:51 PM EDT ----- Macrodantin 100 mg twice a day for 7 days ----- Message ----- From: Veneta Penton, CMA Sent: 03/18/2022   8:14 AM EDT To: Alfredo Martinez, MD   ----- Message ----- From: Nell Range Lab Results In Sent: 03/16/2022   4:36 PM EDT To: [facility]

## 2022-03-19 NOTE — Telephone Encounter (Signed)
Medication sent to pharmacy. Left detailed message for pt.

## 2022-03-27 ENCOUNTER — Other Ambulatory Visit: Payer: Self-pay | Admitting: Family Medicine

## 2022-03-27 DIAGNOSIS — Z1231 Encounter for screening mammogram for malignant neoplasm of breast: Secondary | ICD-10-CM

## 2022-04-10 DIAGNOSIS — R030 Elevated blood-pressure reading, without diagnosis of hypertension: Secondary | ICD-10-CM | POA: Diagnosis not present

## 2022-04-10 DIAGNOSIS — R319 Hematuria, unspecified: Secondary | ICD-10-CM | POA: Diagnosis not present

## 2022-04-10 DIAGNOSIS — Z87440 Personal history of urinary (tract) infections: Secondary | ICD-10-CM | POA: Diagnosis not present

## 2022-05-05 ENCOUNTER — Ambulatory Visit: Payer: Self-pay

## 2022-05-06 DIAGNOSIS — R351 Nocturia: Secondary | ICD-10-CM | POA: Diagnosis not present

## 2022-05-06 DIAGNOSIS — R310 Gross hematuria: Secondary | ICD-10-CM | POA: Diagnosis not present

## 2022-05-13 ENCOUNTER — Ambulatory Visit
Admission: RE | Admit: 2022-05-13 | Discharge: 2022-05-13 | Disposition: A | Discharge status: Home / Self Care | Payer: 59 | Source: Ambulatory Visit | Attending: Family Medicine | Admitting: Family Medicine

## 2022-05-13 DIAGNOSIS — Z1231 Encounter for screening mammogram for malignant neoplasm of breast: Secondary | ICD-10-CM

## 2022-06-18 DIAGNOSIS — D2271 Melanocytic nevi of right lower limb, including hip: Secondary | ICD-10-CM | POA: Diagnosis not present

## 2022-06-18 DIAGNOSIS — D224 Melanocytic nevi of scalp and neck: Secondary | ICD-10-CM | POA: Diagnosis not present

## 2022-06-18 DIAGNOSIS — D225 Melanocytic nevi of trunk: Secondary | ICD-10-CM | POA: Diagnosis not present

## 2022-06-18 DIAGNOSIS — L821 Other seborrheic keratosis: Secondary | ICD-10-CM | POA: Diagnosis not present

## 2022-06-18 DIAGNOSIS — D2262 Melanocytic nevi of left upper limb, including shoulder: Secondary | ICD-10-CM | POA: Diagnosis not present

## 2022-06-18 DIAGNOSIS — D1801 Hemangioma of skin and subcutaneous tissue: Secondary | ICD-10-CM | POA: Diagnosis not present

## 2022-06-18 DIAGNOSIS — L570 Actinic keratosis: Secondary | ICD-10-CM | POA: Diagnosis not present

## 2022-06-29 DIAGNOSIS — Z23 Encounter for immunization: Secondary | ICD-10-CM | POA: Diagnosis not present

## 2022-10-22 ENCOUNTER — Other Ambulatory Visit: Payer: Self-pay | Admitting: Family Medicine

## 2022-10-22 DIAGNOSIS — Z13820 Encounter for screening for osteoporosis: Secondary | ICD-10-CM

## 2022-10-22 DIAGNOSIS — Z780 Asymptomatic menopausal state: Secondary | ICD-10-CM

## 2022-10-30 DIAGNOSIS — M25511 Pain in right shoulder: Secondary | ICD-10-CM | POA: Diagnosis not present

## 2022-11-03 DIAGNOSIS — M25511 Pain in right shoulder: Secondary | ICD-10-CM | POA: Diagnosis not present

## 2022-11-05 DIAGNOSIS — R310 Gross hematuria: Secondary | ICD-10-CM | POA: Diagnosis not present

## 2022-11-05 DIAGNOSIS — R350 Frequency of micturition: Secondary | ICD-10-CM | POA: Diagnosis not present

## 2022-11-10 DIAGNOSIS — M25511 Pain in right shoulder: Secondary | ICD-10-CM | POA: Diagnosis not present

## 2022-11-12 DIAGNOSIS — M25511 Pain in right shoulder: Secondary | ICD-10-CM | POA: Diagnosis not present

## 2022-11-23 DIAGNOSIS — H903 Sensorineural hearing loss, bilateral: Secondary | ICD-10-CM | POA: Diagnosis not present

## 2022-11-24 ENCOUNTER — Other Ambulatory Visit: Payer: 59

## 2022-12-21 ENCOUNTER — Telehealth: Payer: Self-pay | Admitting: *Deleted

## 2022-12-21 NOTE — Telephone Encounter (Signed)
  Bjorn Loser, MD  You22 minutes ago (11:15 AM)   However call my office in [place] [phone] and have her tell the office I said it was okay to add her onto clinic Wednesday or Thursday morning      Spoke with patient and asked her to call [facility] she need to know the times on Wednesday morning, I advised I can not see their schedule. Pt is going out of town Thursday.

## 2022-12-21 NOTE — Telephone Encounter (Signed)
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 Per pt she has been using the estrogen cream as prescribed. Pt asking to be seen today in [place] due to no appts in [place]. Pt lives in [place] and would like to be seen in either office.

## 2022-12-23 DIAGNOSIS — R350 Frequency of micturition: Secondary | ICD-10-CM | POA: Diagnosis not present

## 2022-12-23 DIAGNOSIS — R310 Gross hematuria: Secondary | ICD-10-CM | POA: Diagnosis not present

## 2022-12-29 DIAGNOSIS — H903 Sensorineural hearing loss, bilateral: Secondary | ICD-10-CM | POA: Diagnosis not present

## 2023-02-08 DIAGNOSIS — M25511 Pain in right shoulder: Secondary | ICD-10-CM | POA: Diagnosis not present

## 2023-02-18 ENCOUNTER — Other Ambulatory Visit: Payer: Self-pay | Admitting: Sports Medicine

## 2023-02-18 DIAGNOSIS — M25511 Pain in right shoulder: Secondary | ICD-10-CM

## 2023-03-10 ENCOUNTER — Ambulatory Visit
Admission: RE | Admit: 2023-03-10 | Discharge: 2023-03-10 | Disposition: A | Discharge status: Home / Self Care | Payer: PPO | Source: Ambulatory Visit | Attending: Sports Medicine | Admitting: Sports Medicine

## 2023-03-10 DIAGNOSIS — M25511 Pain in right shoulder: Secondary | ICD-10-CM

## 2023-03-10 DIAGNOSIS — M7581 Other shoulder lesions, right shoulder: Secondary | ICD-10-CM | POA: Diagnosis not present

## 2023-03-31 DIAGNOSIS — M7501 Adhesive capsulitis of right shoulder: Secondary | ICD-10-CM | POA: Diagnosis not present

## 2023-04-05 ENCOUNTER — Other Ambulatory Visit: Payer: Self-pay | Admitting: Family Medicine

## 2023-04-05 DIAGNOSIS — Z1231 Encounter for screening mammogram for malignant neoplasm of breast: Secondary | ICD-10-CM

## 2023-04-06 DIAGNOSIS — M7501 Adhesive capsulitis of right shoulder: Secondary | ICD-10-CM | POA: Diagnosis not present

## 2023-04-12 DIAGNOSIS — M7501 Adhesive capsulitis of right shoulder: Secondary | ICD-10-CM | POA: Diagnosis not present

## 2023-04-14 DIAGNOSIS — M7501 Adhesive capsulitis of right shoulder: Secondary | ICD-10-CM | POA: Diagnosis not present

## 2023-04-19 DIAGNOSIS — M7501 Adhesive capsulitis of right shoulder: Secondary | ICD-10-CM | POA: Diagnosis not present

## 2023-04-20 DIAGNOSIS — M797 Fibromyalgia: Secondary | ICD-10-CM | POA: Diagnosis not present

## 2023-04-20 DIAGNOSIS — N368 Other specified disorders of urethra: Secondary | ICD-10-CM | POA: Diagnosis not present

## 2023-04-20 DIAGNOSIS — Z9181 History of falling: Secondary | ICD-10-CM | POA: Diagnosis not present

## 2023-04-20 DIAGNOSIS — Z0000 Encounter for general adult medical examination without abnormal findings: Secondary | ICD-10-CM | POA: Diagnosis not present

## 2023-04-20 DIAGNOSIS — Z136 Encounter for screening for cardiovascular disorders: Secondary | ICD-10-CM | POA: Diagnosis not present

## 2023-04-20 DIAGNOSIS — Z124 Encounter for screening for malignant neoplasm of cervix: Secondary | ICD-10-CM | POA: Diagnosis not present

## 2023-04-20 DIAGNOSIS — R7303 Prediabetes: Secondary | ICD-10-CM | POA: Diagnosis not present

## 2023-04-20 DIAGNOSIS — Z1159 Encounter for screening for other viral diseases: Secondary | ICD-10-CM | POA: Diagnosis not present

## 2023-04-20 DIAGNOSIS — Z23 Encounter for immunization: Secondary | ICD-10-CM | POA: Diagnosis not present

## 2023-04-21 DIAGNOSIS — M7501 Adhesive capsulitis of right shoulder: Secondary | ICD-10-CM | POA: Diagnosis not present

## 2023-04-28 ENCOUNTER — Inpatient Hospital Stay: Admission: RE | Admit: 2023-04-28 | Payer: PPO | Source: Ambulatory Visit

## 2023-04-28 DIAGNOSIS — M7501 Adhesive capsulitis of right shoulder: Secondary | ICD-10-CM | POA: Diagnosis not present

## 2023-04-28 DIAGNOSIS — M8588 Other specified disorders of bone density and structure, other site: Secondary | ICD-10-CM | POA: Diagnosis not present

## 2023-04-28 DIAGNOSIS — Z780 Asymptomatic menopausal state: Secondary | ICD-10-CM

## 2023-04-28 DIAGNOSIS — E349 Endocrine disorder, unspecified: Secondary | ICD-10-CM | POA: Diagnosis not present

## 2023-04-28 DIAGNOSIS — N958 Other specified menopausal and perimenopausal disorders: Secondary | ICD-10-CM | POA: Diagnosis not present

## 2023-04-28 DIAGNOSIS — Z13820 Encounter for screening for osteoporosis: Secondary | ICD-10-CM

## 2023-05-05 DIAGNOSIS — M810 Age-related osteoporosis without current pathological fracture: Secondary | ICD-10-CM | POA: Diagnosis not present

## 2023-05-10 DIAGNOSIS — M810 Age-related osteoporosis without current pathological fracture: Secondary | ICD-10-CM | POA: Diagnosis not present

## 2023-05-18 ENCOUNTER — Ambulatory Visit
Admission: RE | Admit: 2023-05-18 | Discharge: 2023-05-18 | Disposition: A | Discharge status: Home / Self Care | Payer: PPO | Source: Ambulatory Visit | Attending: Family Medicine | Admitting: Family Medicine

## 2023-05-18 DIAGNOSIS — Z1231 Encounter for screening mammogram for malignant neoplasm of breast: Secondary | ICD-10-CM

## 2023-07-05 DIAGNOSIS — D2262 Melanocytic nevi of left upper limb, including shoulder: Secondary | ICD-10-CM | POA: Diagnosis not present

## 2023-07-05 DIAGNOSIS — D1801 Hemangioma of skin and subcutaneous tissue: Secondary | ICD-10-CM | POA: Diagnosis not present

## 2023-07-05 DIAGNOSIS — D2272 Melanocytic nevi of left lower limb, including hip: Secondary | ICD-10-CM | POA: Diagnosis not present

## 2023-07-05 DIAGNOSIS — D225 Melanocytic nevi of trunk: Secondary | ICD-10-CM | POA: Diagnosis not present

## 2023-07-05 DIAGNOSIS — L821 Other seborrheic keratosis: Secondary | ICD-10-CM | POA: Diagnosis not present

## 2023-07-05 DIAGNOSIS — L570 Actinic keratosis: Secondary | ICD-10-CM | POA: Diagnosis not present

## 2023-07-05 DIAGNOSIS — L812 Freckles: Secondary | ICD-10-CM | POA: Diagnosis not present

## 2023-07-05 DIAGNOSIS — D2261 Melanocytic nevi of right upper limb, including shoulder: Secondary | ICD-10-CM | POA: Diagnosis not present

## 2023-07-07 DIAGNOSIS — H43813 Vitreous degeneration, bilateral: Secondary | ICD-10-CM | POA: Diagnosis not present

## 2023-07-07 DIAGNOSIS — H5213 Myopia, bilateral: Secondary | ICD-10-CM | POA: Diagnosis not present

## 2023-07-07 DIAGNOSIS — H04123 Dry eye syndrome of bilateral lacrimal glands: Secondary | ICD-10-CM | POA: Diagnosis not present

## 2023-07-07 DIAGNOSIS — H26493 Other secondary cataract, bilateral: Secondary | ICD-10-CM | POA: Diagnosis not present

## 2023-07-07 DIAGNOSIS — H524 Presbyopia: Secondary | ICD-10-CM | POA: Diagnosis not present

## 2023-12-01 DIAGNOSIS — M25511 Pain in right shoulder: Secondary | ICD-10-CM | POA: Diagnosis not present

## 2024-04-04 ENCOUNTER — Other Ambulatory Visit: Payer: Self-pay | Admitting: Family Medicine

## 2024-04-04 DIAGNOSIS — Z1231 Encounter for screening mammogram for malignant neoplasm of breast: Secondary | ICD-10-CM

## 2024-05-03 DIAGNOSIS — Z0000 Encounter for general adult medical examination without abnormal findings: Secondary | ICD-10-CM | POA: Diagnosis not present

## 2024-05-03 DIAGNOSIS — Z136 Encounter for screening for cardiovascular disorders: Secondary | ICD-10-CM | POA: Diagnosis not present

## 2024-05-03 DIAGNOSIS — Z13220 Encounter for screening for lipoid disorders: Secondary | ICD-10-CM | POA: Diagnosis not present

## 2024-05-03 DIAGNOSIS — R7303 Prediabetes: Secondary | ICD-10-CM | POA: Diagnosis not present

## 2024-05-04 DIAGNOSIS — M810 Age-related osteoporosis without current pathological fracture: Secondary | ICD-10-CM | POA: Diagnosis not present

## 2024-05-04 DIAGNOSIS — Z7184 Encounter for health counseling related to travel: Secondary | ICD-10-CM | POA: Diagnosis not present

## 2024-05-04 DIAGNOSIS — N952 Postmenopausal atrophic vaginitis: Secondary | ICD-10-CM | POA: Diagnosis not present

## 2024-05-04 DIAGNOSIS — F419 Anxiety disorder, unspecified: Secondary | ICD-10-CM | POA: Diagnosis not present

## 2024-05-04 DIAGNOSIS — R7303 Prediabetes: Secondary | ICD-10-CM | POA: Diagnosis not present

## 2024-05-04 DIAGNOSIS — Z0000 Encounter for general adult medical examination without abnormal findings: Secondary | ICD-10-CM | POA: Diagnosis not present

## 2024-05-04 DIAGNOSIS — Z1331 Encounter for screening for depression: Secondary | ICD-10-CM | POA: Diagnosis not present

## 2024-05-18 ENCOUNTER — Ambulatory Visit
Admission: RE | Admit: 2024-05-18 | Discharge: 2024-05-18 | Disposition: A | Discharge status: Home / Self Care | Source: Ambulatory Visit | Attending: Family Medicine | Admitting: Family Medicine

## 2024-05-18 DIAGNOSIS — Z1231 Encounter for screening mammogram for malignant neoplasm of breast: Secondary | ICD-10-CM

## 2024-07-17 DIAGNOSIS — D1801 Hemangioma of skin and subcutaneous tissue: Secondary | ICD-10-CM | POA: Diagnosis not present

## 2024-07-17 DIAGNOSIS — D225 Melanocytic nevi of trunk: Secondary | ICD-10-CM | POA: Diagnosis not present

## 2024-07-17 DIAGNOSIS — D2262 Melanocytic nevi of left upper limb, including shoulder: Secondary | ICD-10-CM | POA: Diagnosis not present

## 2024-07-17 DIAGNOSIS — L812 Freckles: Secondary | ICD-10-CM | POA: Diagnosis not present

## 2024-07-17 DIAGNOSIS — L821 Other seborrheic keratosis: Secondary | ICD-10-CM | POA: Diagnosis not present

## 2024-07-21 DIAGNOSIS — H43813 Vitreous degeneration, bilateral: Secondary | ICD-10-CM | POA: Diagnosis not present

## 2024-07-21 DIAGNOSIS — H524 Presbyopia: Secondary | ICD-10-CM | POA: Diagnosis not present

## 2024-07-21 DIAGNOSIS — H5213 Myopia, bilateral: Secondary | ICD-10-CM | POA: Diagnosis not present

## 2024-07-21 DIAGNOSIS — H04123 Dry eye syndrome of bilateral lacrimal glands: Secondary | ICD-10-CM | POA: Diagnosis not present

## 2024-07-21 DIAGNOSIS — H52203 Unspecified astigmatism, bilateral: Secondary | ICD-10-CM | POA: Diagnosis not present

## 2024-07-21 DIAGNOSIS — H26493 Other secondary cataract, bilateral: Secondary | ICD-10-CM | POA: Diagnosis not present

## 2024-08-24 DIAGNOSIS — H903 Sensorineural hearing loss, bilateral: Secondary | ICD-10-CM | POA: Diagnosis not present
